# Patient Record
Sex: Female | Born: 2000 | Race: White | Hispanic: No | Marital: Single | Smoking: Never smoker
Health system: Southern US, Community
[De-identification: ages and names within clinical notes are randomized; demographics above are authoritative.]

---
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
## 2018-01-14 ENCOUNTER — Other Ambulatory Visit: Payer: Self-pay | Admitting: Advanced Practice Midwife

## 2018-01-14 DIAGNOSIS — Z3041 Encounter for surveillance of contraceptive pills: Secondary | ICD-10-CM

## 2018-01-14 DIAGNOSIS — Z01419 Encounter for gynecological examination (general) (routine) without abnormal findings: Secondary | ICD-10-CM

## 2018-01-17 NOTE — Telephone Encounter (Signed)
Please advise 

## 2018-02-27 ENCOUNTER — Ambulatory Visit
Admission: RE | Admit: 2018-02-27 | Discharge: 2018-02-27 | Disposition: A | Discharge status: Home / Self Care | Payer: 59 | Source: Ambulatory Visit | Attending: Pediatrics | Admitting: Pediatrics

## 2018-02-27 ENCOUNTER — Other Ambulatory Visit: Payer: Self-pay | Admitting: Pediatrics

## 2018-02-27 DIAGNOSIS — M79671 Pain in right foot: Secondary | ICD-10-CM | POA: Insufficient documentation

## 2018-02-27 DIAGNOSIS — S99921A Unspecified injury of right foot, initial encounter: Secondary | ICD-10-CM | POA: Diagnosis not present

## 2018-02-27 DIAGNOSIS — M79674 Pain in right toe(s): Secondary | ICD-10-CM | POA: Diagnosis not present

## 2018-03-01 DIAGNOSIS — R55 Syncope and collapse: Secondary | ICD-10-CM | POA: Diagnosis not present

## 2018-03-01 DIAGNOSIS — Z13228 Encounter for screening for other metabolic disorders: Secondary | ICD-10-CM | POA: Diagnosis not present

## 2018-03-08 DIAGNOSIS — Z0000 Encounter for general adult medical examination without abnormal findings: Secondary | ICD-10-CM | POA: Diagnosis not present

## 2018-03-08 DIAGNOSIS — Z68.41 Body mass index (BMI) pediatric, 5th percentile to less than 85th percentile for age: Secondary | ICD-10-CM | POA: Diagnosis not present

## 2018-03-08 DIAGNOSIS — Z713 Dietary counseling and surveillance: Secondary | ICD-10-CM | POA: Diagnosis not present

## 2018-04-05 DIAGNOSIS — Z23 Encounter for immunization: Secondary | ICD-10-CM | POA: Diagnosis not present

## 2018-05-04 DIAGNOSIS — Z113 Encounter for screening for infections with a predominantly sexual mode of transmission: Secondary | ICD-10-CM | POA: Diagnosis not present

## 2018-05-04 DIAGNOSIS — R69 Illness, unspecified: Secondary | ICD-10-CM | POA: Diagnosis not present

## 2018-05-04 DIAGNOSIS — B373 Candidiasis of vulva and vagina: Secondary | ICD-10-CM | POA: Diagnosis not present

## 2018-05-09 DIAGNOSIS — B349 Viral infection, unspecified: Secondary | ICD-10-CM | POA: Diagnosis not present

## 2018-05-15 DIAGNOSIS — J029 Acute pharyngitis, unspecified: Secondary | ICD-10-CM | POA: Diagnosis not present

## 2018-05-15 DIAGNOSIS — J069 Acute upper respiratory infection, unspecified: Secondary | ICD-10-CM | POA: Diagnosis not present

## 2018-05-23 DIAGNOSIS — M79671 Pain in right foot: Secondary | ICD-10-CM | POA: Diagnosis not present

## 2018-05-28 ENCOUNTER — Ambulatory Visit
Admission: EM | Admit: 2018-05-28 | Discharge: 2018-05-28 | Disposition: A | Discharge status: Home / Self Care | Payer: 59 | Attending: Family Medicine | Admitting: Family Medicine

## 2018-05-28 DIAGNOSIS — R300 Dysuria: Secondary | ICD-10-CM | POA: Diagnosis not present

## 2018-05-28 DIAGNOSIS — N3001 Acute cystitis with hematuria: Secondary | ICD-10-CM

## 2018-05-28 LAB — URINALYSIS, COMPLETE (UACMP) WITH MICROSCOPIC
BILIRUBIN URINE: NEGATIVE
GLUCOSE, UA: NEGATIVE mg/dL
KETONES UR: NEGATIVE mg/dL
NITRITE: NEGATIVE
RBC / HPF: >50 RBC/hpf (ref 0–5)
Specific Gravity, Urine: 1.025 (ref 1.005–1.030)
pH: 7.5 (ref 5.0–8.0)

## 2018-05-28 MED ORDER — NITROFURANTOIN MONOHYD MACRO 100 MG PO CAPS: 100.0000 mg | ORAL_CAPSULE | Freq: Two times a day (BID) | ORAL | 0 refills | Status: AC

## 2018-05-28 NOTE — ED Triage Notes (Signed)
Pt here for possible UTI for 2 days, pressure, dysuria at the end of her stream and blood on the tissue when she went this morning. Frequency but unable to empty her bladder completely.

## 2018-05-28 NOTE — Discharge Instructions (Signed)
Recommend start Macrobid 100mg  twice a day as directed. Increase fluid intake, especially cranberry juice. Follow-up pending urine culture results.

## 2018-05-28 NOTE — ED Provider Notes (Signed)
[facility]    CSN: [id number] Arrival date & time: 05/28/18  1526     History   Chief Complaint Chief Complaint  Patient presents with  . Urinary Tract Infection    HPI Patricia Gallegos is a 17 y.o. female.   17 year old female presents with dysuria, urinary frequency, urgency and lower abdominal pressure for the past 2 days. Denies any fever or GI symptoms. Also has noticed a little bit of blood on the tissue when she wipes. History of occasional UTIs- last UTI about 2 years ago. Tends to get UTI's when she "holds her urine in". She drove back from [place] 3 days ago and didn't stop to urinate. Started having urinary symptoms the next day. Has taken Cranberry juice with some relief. Sexually active and currently on oral contraceptives. No other chronic health issues except seasonal allergies and takes Zyrtec daily.   The history is provided by the patient.    History reviewed. No pertinent past medical history.  Patient Active Problem List   Diagnosis Date Noted  . 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 No pertinent surgical history.  OB History    Gravida  0   Para  0   Term  0   Preterm  0   AB  0   Living  0     SAB  0   TAB  0   Ectopic  0   Multiple  0   Live Births  0            Home Medications    Prior to Admission medications   Medication Sig Start Date End Date Taking? Authorizing Provider  cetirizine (ZYRTEC) 10 MG tablet Take 10 mg by mouth daily.   Yes [provider]  SPRINTEC 28 0.25-35 MG-MCG tablet TAKE 1 TABLET BY MOUTH EVERY DAY 01/17/18  Yes Tresea Mall, CNM  nitrofurantoin, macrocrystal-monohydrate, (MACROBID) 100 MG capsule Take 1 capsule (100 mg total) by mouth 2 (two) times daily for 5 days. 05/28/18 06/02/18  Sudie Grumbling, NP    Family History Family History  Problem Relation Age of Onset  . Lung cancer Paternal Grandfather     Social History Social History    Tobacco Use  . Smoking status: Never Smoker  . Smokeless tobacco: Never Used  Substance Use Topics  . Alcohol use: No  . Drug use: No     Allergies   Patient has no known allergies.   Review of Systems Review of Systems  Constitutional: Negative for activity change, appetite change, chills, fatigue and fever.  Respiratory: Negative for cough, chest tightness, shortness of breath and wheezing.   Cardiovascular: Negative for chest pain and palpitations.  Gastrointestinal: Positive for abdominal pain (lower abdominal pressure). Negative for blood in stool, diarrhea, nausea and vomiting.  Genitourinary: Positive for decreased urine volume, dysuria, frequency, hematuria and urgency. Negative for difficulty urinating, flank pain, genital sores, menstrual problem, pelvic pain, vaginal bleeding, vaginal discharge and vaginal pain.  Musculoskeletal: Negative for arthralgias, back pain and myalgias.  Skin: Negative for color change, rash and wound.  Allergic/Immunologic: Positive for environmental allergies. Negative for immunocompromised state.  Neurological: Negative for dizziness, tremors, seizures, syncope, weakness, light-headedness, numbness and headaches.  Hematological: Negative for adenopathy. Does not bruise/bleed easily.  Psychiatric/Behavioral: Negative.      Physical Exam Triage Vital Signs ED Triage Vitals  Enc Vitals Group     BP 05/28/18 1534 126/81  Pulse Rate 05/28/18 1534 79     Resp 05/28/18 1534 18     Temp 05/28/18 1534 98.7 F (37.1 C)     Temp Source 05/28/18 1534 Oral     SpO2 05/28/18 1534 99 %     Weight 05/28/18 1535 152 lb (68.9 kg)     Height --      Head Circumference --      Peak Flow --      Pain Score 05/28/18 1535 0     Pain Loc --      Pain Edu? --      Excl. in GC? --    No data found.  Updated Vital Signs BP 126/81 (BP Location: Left Arm)   Pulse 79   Temp 98.7 F (37.1 C) (Oral)   Resp 18   Wt 152 lb (68.9 kg)   LMP  05/17/2018 (Exact Date)   SpO2 99%   BMI 26.09 kg/m   Visual Acuity Right Eye Distance:   Left Eye Distance:   Bilateral Distance:    Right Eye Near:   Left Eye Near:    Bilateral Near:     Physical Exam  Constitutional: She is oriented to person, place, and time. Vital signs are normal. She appears well-developed and well-nourished. She is cooperative. She does not appear ill. No distress.  Patient sitting on exam table in no acute distress.   HENT:  Head: Normocephalic and atraumatic.  Eyes: Conjunctivae and EOM are normal.  Neck: Normal range of motion.  Cardiovascular: Normal rate, regular rhythm and normal heart sounds.  No murmur heard. Pulmonary/Chest: Effort normal and breath sounds normal. No respiratory distress. She has no decreased breath sounds. She has no wheezes. She has no rhonchi. She has no rales.  Abdominal: Soft. Normal appearance and bowel sounds are normal. She exhibits no distension and no mass. There is no hepatosplenomegaly. There is tenderness in the suprapubic area. There is no rigidity, no rebound, no guarding and no CVA tenderness.  Neurological: She is alert and oriented to person, place, and time.  Skin: Skin is warm and dry. No rash noted.  Psychiatric: She has a normal mood and affect. Her behavior is normal. Judgment and thought content normal.  Vitals reviewed.    UC Treatments / Results  Labs (all labs ordered are listed, but only abnormal results are displayed) Labs Reviewed  URINALYSIS, COMPLETE (UACMP) WITH MICROSCOPIC - Abnormal; Notable for the following components:      Result Value   APPearance CLOUDY (*)    Hgb urine dipstick LARGE (*)    Protein, ur >300 (*)    Leukocytes, UA SMALL (*)    Bacteria, UA FEW (*)    All other components within normal limits  URINE CULTURE    EKG None  Radiology No results found.  Procedures Procedures (including critical care time)  Medications Ordered in UC Medications - No data to  display  Initial Impression / Assessment and Plan / UC Course  I have reviewed the triage vital signs and the nursing notes.  Pertinent labs & imaging results that were available during my care of the patient were reviewed by me and considered in my medical decision making (see chart for details).    Reviewed urinalysis results with patient- probable UTI. Recommend start Macrobid 100mg  twice a day as directed. Increase fluid intake- continue Cranberry juice. Try to avoid holding in urine to help decrease recurrence of symptoms. Follow-up pending urine culture results.  Final  Clinical Impressions(s) / UC Diagnoses   Final diagnoses:  Dysuria  Hematuria due to acute cystitis     Discharge Instructions     Recommend start Macrobid 100mg  twice a day as directed. Increase fluid intake, especially cranberry juice. Follow-up pending urine culture results.     ED Prescriptions    Medication Sig Dispense Auth. Provider   nitrofurantoin, macrocrystal-monohydrate, (MACROBID) 100 MG capsule Take 1 capsule (100 mg total) by mouth 2 (two) times daily for 5 days. 10 capsule Sudie Grumbling, NP     Controlled Substance Prescriptions [place] Controlled Substance Registry consulted? Not Applicable   Sudie Grumbling, NP 05/28/18 1636

## 2018-05-31 LAB — URINE CULTURE
Culture: >=100000 — AB
Special Requests: NORMAL

## 2018-07-03 DIAGNOSIS — M79671 Pain in right foot: Secondary | ICD-10-CM | POA: Diagnosis not present

## 2018-07-06 DIAGNOSIS — S93491S Sprain of other ligament of right ankle, sequela: Secondary | ICD-10-CM | POA: Diagnosis not present

## 2018-07-12 DIAGNOSIS — M25671 Stiffness of right ankle, not elsewhere classified: Secondary | ICD-10-CM | POA: Diagnosis not present

## 2018-07-12 DIAGNOSIS — M6281 Muscle weakness (generalized): Secondary | ICD-10-CM | POA: Diagnosis not present

## 2018-07-12 DIAGNOSIS — M79671 Pain in right foot: Secondary | ICD-10-CM | POA: Diagnosis not present

## 2018-07-17 DIAGNOSIS — M6281 Muscle weakness (generalized): Secondary | ICD-10-CM | POA: Diagnosis not present

## 2018-07-17 DIAGNOSIS — M25671 Stiffness of right ankle, not elsewhere classified: Secondary | ICD-10-CM | POA: Diagnosis not present

## 2018-07-17 DIAGNOSIS — M79671 Pain in right foot: Secondary | ICD-10-CM | POA: Diagnosis not present

## 2018-07-25 DIAGNOSIS — M6281 Muscle weakness (generalized): Secondary | ICD-10-CM | POA: Diagnosis not present

## 2018-07-25 DIAGNOSIS — M25671 Stiffness of right ankle, not elsewhere classified: Secondary | ICD-10-CM | POA: Diagnosis not present

## 2018-07-25 DIAGNOSIS — M79671 Pain in right foot: Secondary | ICD-10-CM | POA: Diagnosis not present

## 2018-09-19 DIAGNOSIS — B349 Viral infection, unspecified: Secondary | ICD-10-CM | POA: Diagnosis not present

## 2018-10-10 DIAGNOSIS — H524 Presbyopia: Secondary | ICD-10-CM | POA: Diagnosis not present

## 2018-12-29 DIAGNOSIS — R69 Illness, unspecified: Secondary | ICD-10-CM | POA: Diagnosis not present

## 2018-12-29 DIAGNOSIS — M546 Pain in thoracic spine: Secondary | ICD-10-CM | POA: Diagnosis not present

## 2018-12-29 DIAGNOSIS — Z113 Encounter for screening for infections with a predominantly sexual mode of transmission: Secondary | ICD-10-CM | POA: Diagnosis not present

## 2018-12-29 DIAGNOSIS — N341 Nonspecific urethritis: Secondary | ICD-10-CM | POA: Diagnosis not present

## 2018-12-29 DIAGNOSIS — R300 Dysuria: Secondary | ICD-10-CM | POA: Diagnosis not present

## 2018-12-29 DIAGNOSIS — M545 Low back pain: Secondary | ICD-10-CM | POA: Diagnosis not present

## 2019-06-25 DIAGNOSIS — S93401A Sprain of unspecified ligament of right ankle, initial encounter: Secondary | ICD-10-CM | POA: Diagnosis not present

## 2019-10-28 IMAGING — CR DG FOOT COMPLETE 3+V*R*
4 series · 4 of 4 positions shown · non-contrast
Comparison: None.

CLINICAL DATA: Pt states she stubbed pinky toe 3 weeks ago on bed
frame and still having pain at 5th MTP joint and radiates into ball
of foot and down side of 5th MT bone to base of 5th MT as well and
back into 4th MTP area

EXAM:
RIGHT FOOT COMPLETE - 3+ VIEW

[foot ap]
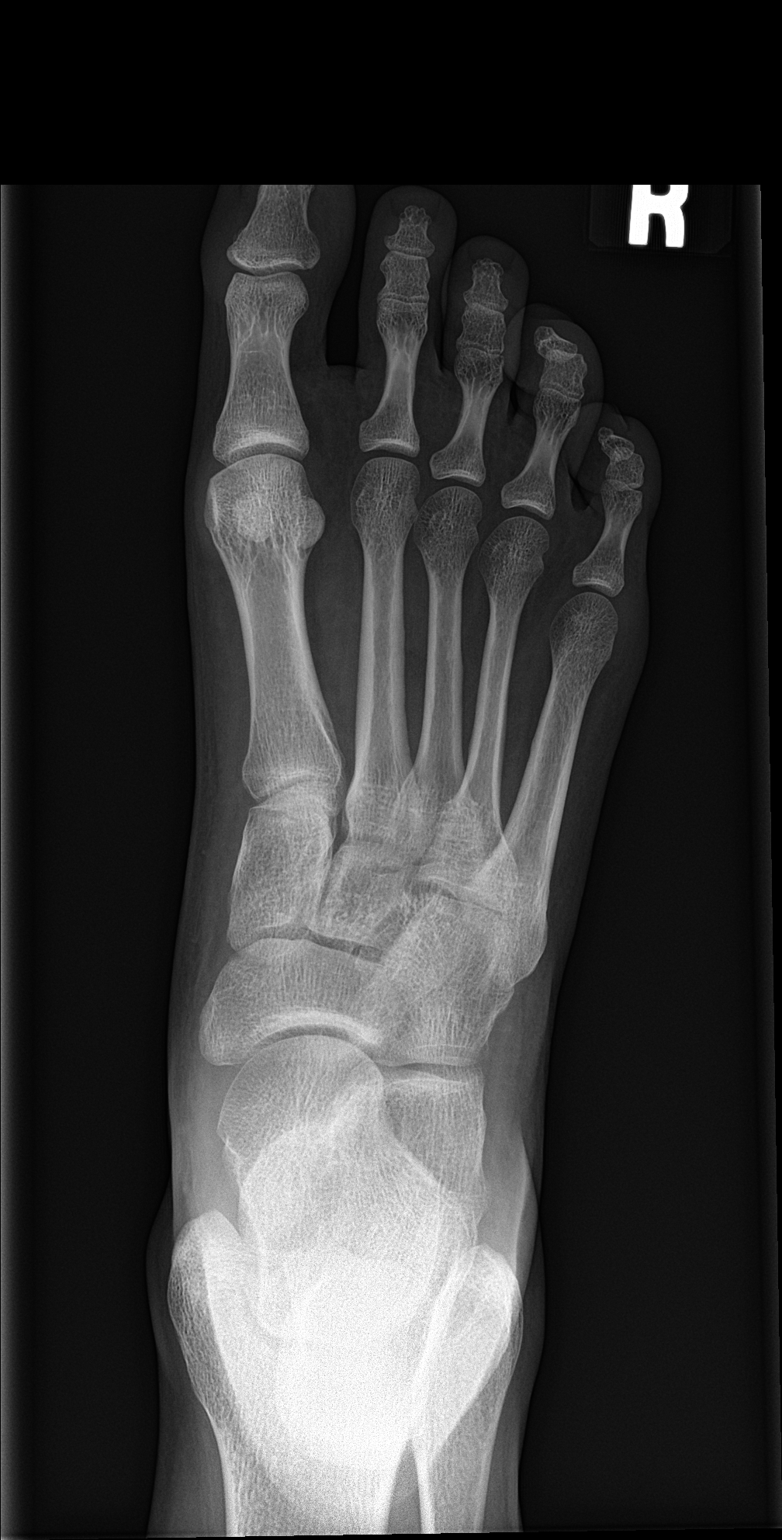

[foot obl]
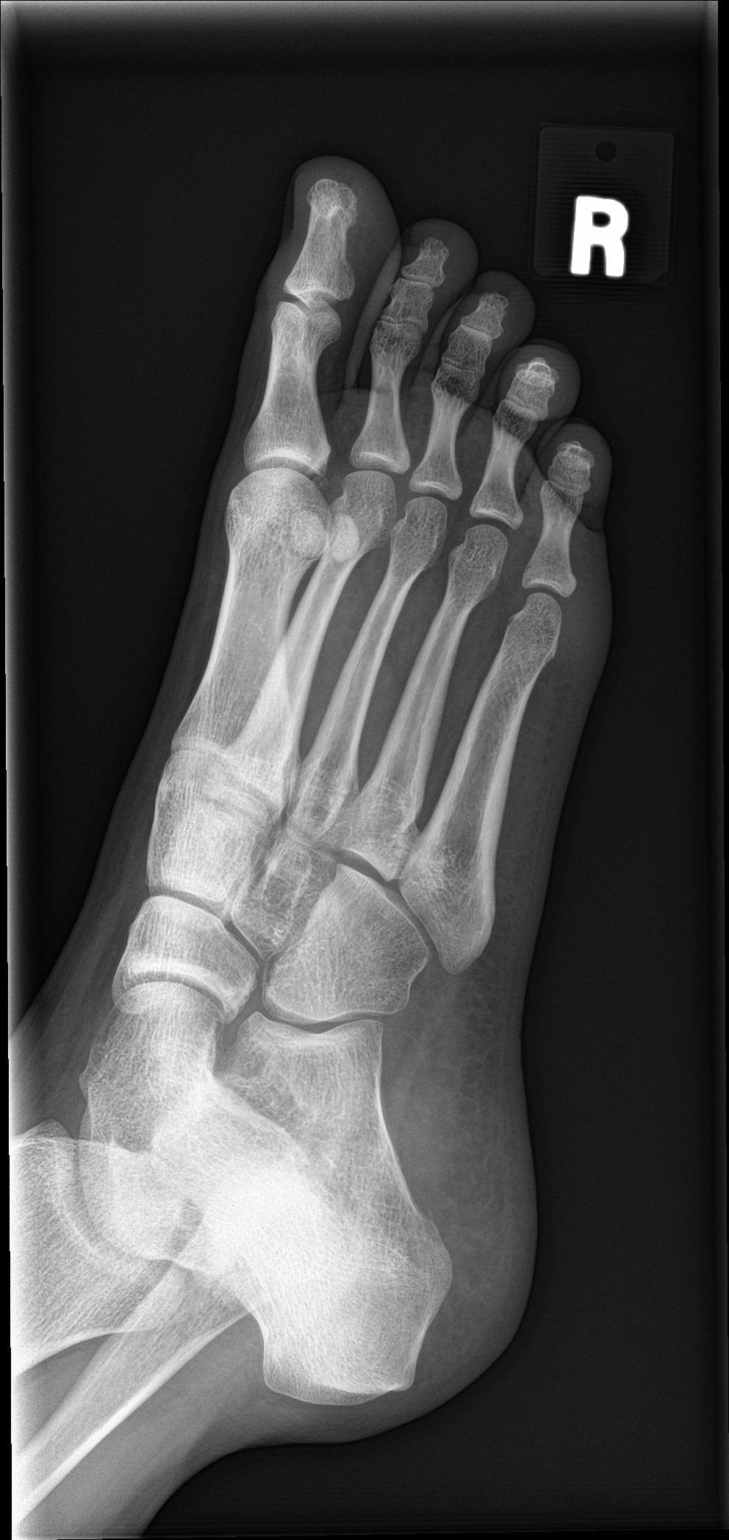

[foot lat (1 of 2)]
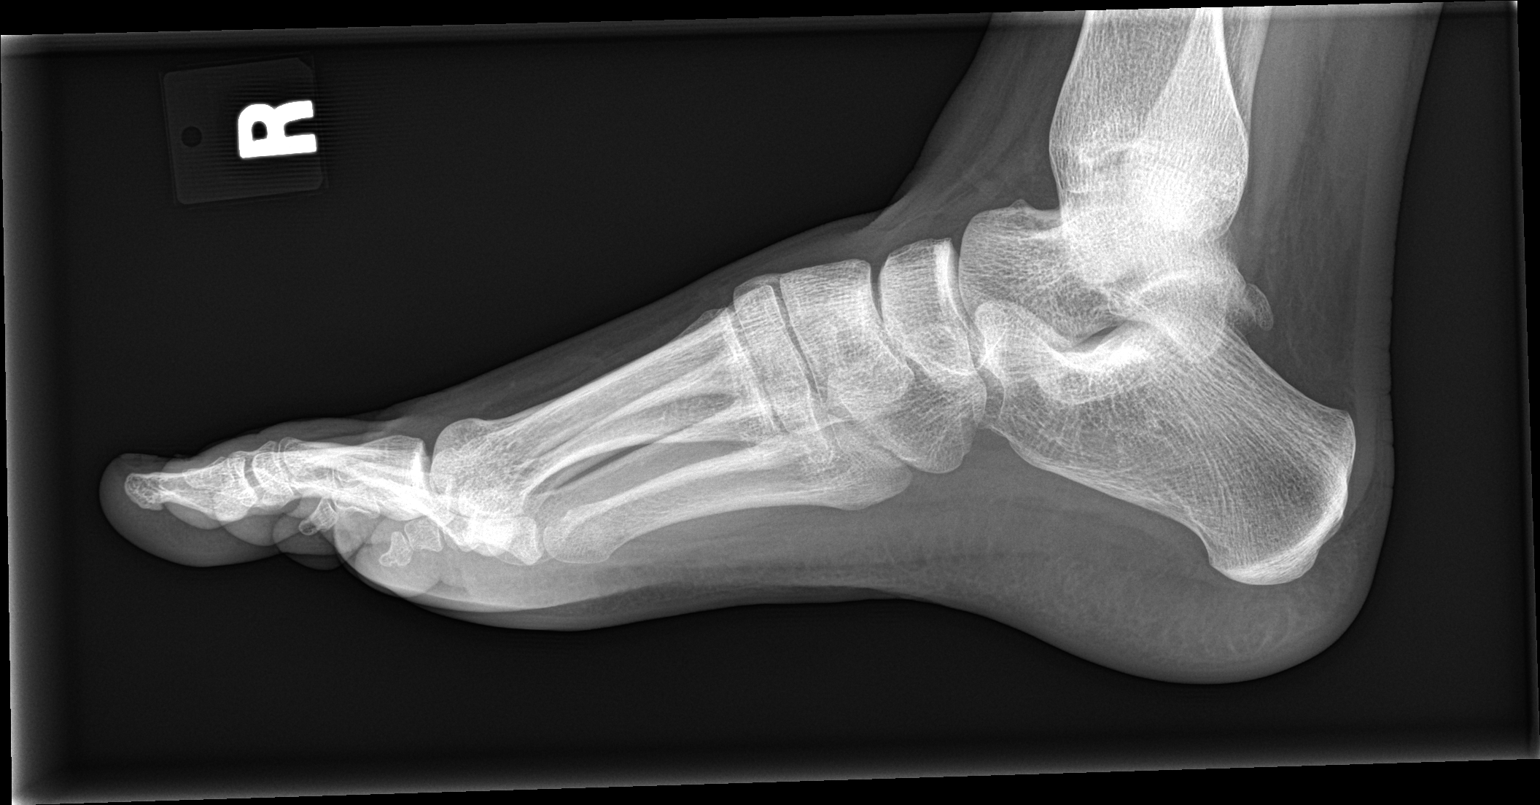

[foot lat (2 of 2)]
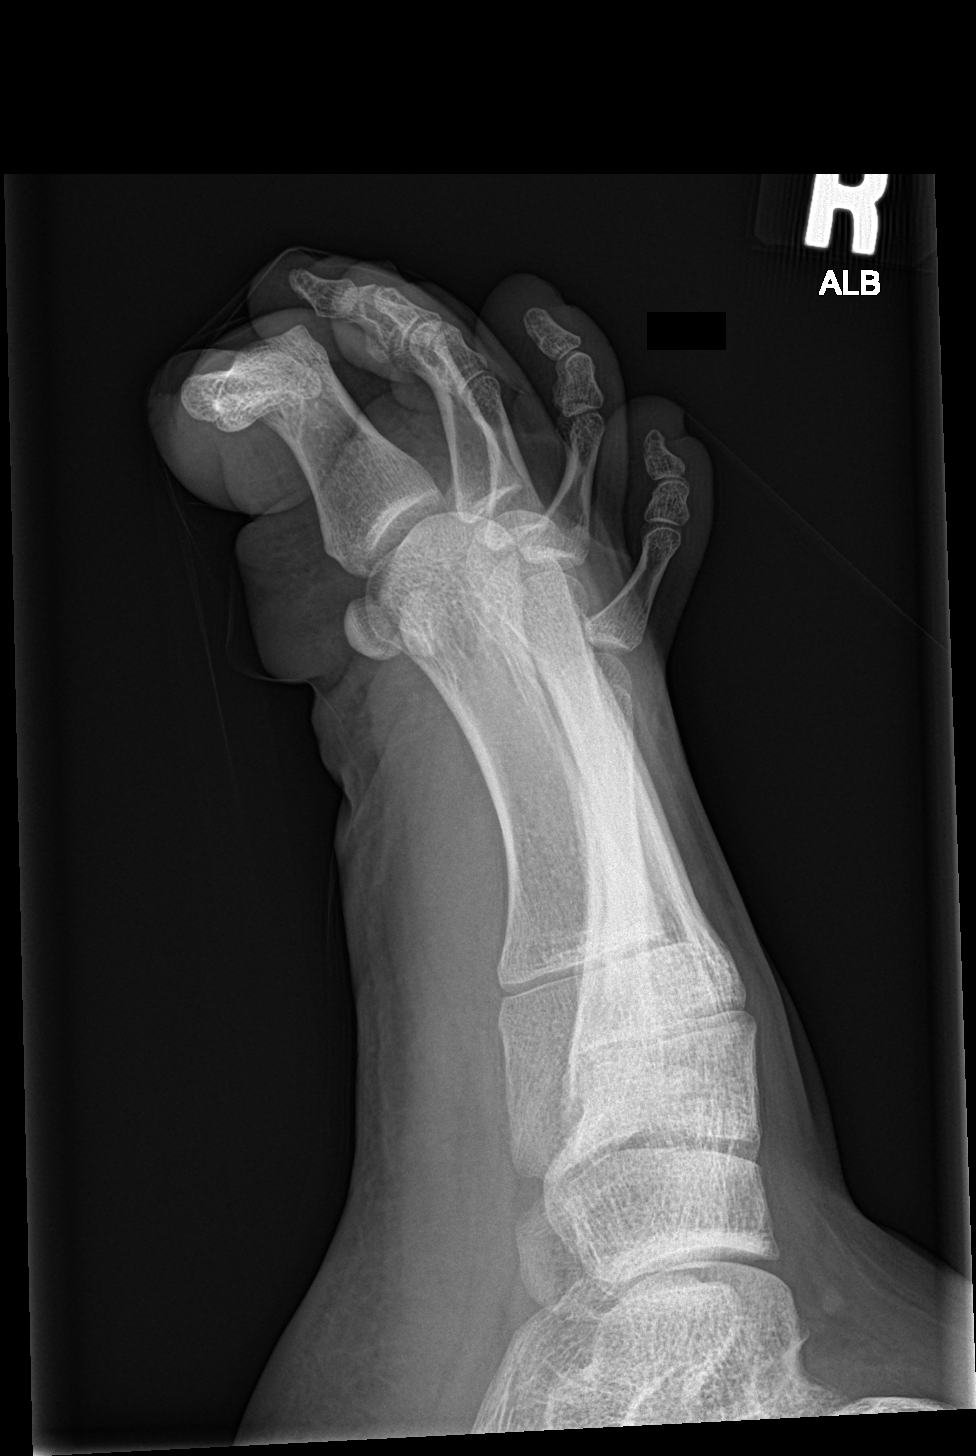

[4 of 4 positions shown; findings below may reference images not displayed]

FINDINGS: No fracture or dislocation of mid foot or forefoot. The phalanges
are normal. The calcaneus is normal. No soft tissue abnormality.
IMPRESSION: No fracture or dislocation. Particular attention directed to the
fifth digit.
